# Patient Record
Sex: Female | Born: 1935 | Race: White | Hispanic: No
Health system: Midwestern US, Academic
[De-identification: ages and names within clinical notes are randomized; demographics above are authoritative.]

---

## 2020-07-17 ENCOUNTER — Encounter: Admit: 2020-07-17 | Discharge: 2020-07-17

## 2020-07-21 ENCOUNTER — Encounter: Admit: 2020-07-21 | Discharge: 2020-07-21 | Payer: MEDICARE

## 2020-07-24 ENCOUNTER — Encounter: Admit: 2020-07-24 | Discharge: 2020-07-24 | Payer: MEDICARE

## 2020-07-24 NOTE — Telephone Encounter
Pre Visit Planning- New Patient    Records received: No    Orders have been NA    Patient inactive in MyChart..     Left voicemail message.    Updated chart: Not assessed    MRI PACS 2021

## 2020-07-28 ENCOUNTER — Encounter: Admit: 2020-07-28 | Discharge: 2020-07-28 | Payer: MEDICARE

## 2020-07-28 ENCOUNTER — Ambulatory Visit: Admit: 2020-07-28 | Discharge: 2020-07-28 | Payer: MEDICARE

## 2020-07-28 DIAGNOSIS — M461 Sacroiliitis, not elsewhere classified: Secondary | ICD-10-CM

## 2020-07-28 DIAGNOSIS — M5137 Other intervertebral disc degeneration, lumbosacral region: Secondary | ICD-10-CM

## 2020-07-28 DIAGNOSIS — I10 Essential (primary) hypertension: Secondary | ICD-10-CM

## 2020-07-28 NOTE — Patient Instructions
Consider sacroiliac Joint injection for low back pain.

## 2020-07-28 NOTE — Progress Notes
SPINE CENTER HISTORY AND PHYSICAL    Chief Complaint:   Chief Complaint   Patient presents with   ? New Patient     lbp       Subjective     HISTORY OF PRESENT ILLNESS:   Cynthia Vega is a 87 y.o. female who  has a past medical history of Hypertension. who presents for evaluation.Patient is presenting with longstanding history of low back pain.  She reports the pain started back in 2012.  Pain is mostly in her lower back and radiates down her left leg to her left knee.  She reports being seen by a pain specialist in [place] who did a right-sided L4-5, L5-S1 epidural steroid injection which it sounds like gave her relief of symptoms going down the leg but continued low back pain.  She reports the pain is constant nature, aching, tiring, nagging, shooting, sharp.  Pain is made better with laying down, standing.  Pain is worse with sitting in 1 position for too long and trying to get up.  Pain is better in the morning when she gets out of bed.  She is unable to take NSAIDs due to her blood thinning medication.  She is taking oxycodone and Tylenol with some relief.  She is done 6 weeks of formal physical therapy but was not making any progress so they discontinued it.  She has not had any spine surgery in the past.  She is scheduled to see her pain specialist in [place] in a week from now.             Amna Passmore denies any recent fevers, chills, infection, antibiotics, bowel or bladder incontinence, saddle anesthesia, bleeding issues, or recent anticoagulant.     ROS:   A 10 point review of systems is negative except for what is noted in the HPI above.    Past Medical History:  Medical History:   Diagnosis Date   ? Hypertension        Family History:  No family history on file.    Social History:  Lives in [place] [address]    Social History     Socioeconomic History   ? Marital status: Widowed   Tobacco Use   ? Smoking status: Never Smoker   ? Smokeless tobacco: Never Used   Substance and Sexual Activity   ? Alcohol use: Not Currently   ? Drug use: Never       Allergies:  No Known Allergies    Medications:    Current Outpatient Medications:   ?  acetaminophen (TYLENOL EXTRA STRENGTH) 500 mg tablet, Take 650 mg by mouth every 6 hours as needed., Disp: , Rfl:   ?  levothyroxine (SYNTHROID) 50 mcg tablet, levothyroxine 50 mcg tablet  TAKE 1 TABLET BY MOUTH EVERY DAY, Disp: , Rfl:   ?  losartan (COZAAR) 25 mg tablet, losartan 25 mg tablet  TAKE 1 TABLET BY MOUTH EVERY DAY, Disp: , Rfl:   ?  meloxicam (MOBIC) 15 mg tablet, meloxicam 15 mg tablet, Disp: , Rfl:   ?  metoprolol XL (TOPROL XL) 50 mg extended release tablet, metoprolol succinate ER 50 mg tablet,extended release 24 hr, Disp: , Rfl:   ?  minocycline (MINOCIN) 100 mg capsule, minocycline 100 mg capsule  TAKE 1 CAPSULE BY MOUTH TWICE A DAY, Disp: , Rfl:   ?  oxyCODONE (ROXICODONE) 5 mg tablet, Take 5 mg by mouth., Disp: , Rfl:   ?  rivaroxaban (XARELTO PO), Take  by mouth., Disp: , Rfl:   ?  traMADoL (ULTRAM) 50 mg tablet, tramadol 50 mg tablet  TAKE 1 TABLET BY MOUTH 3 TIMES A DAY AS NEEDED FOR PAIN . MAY CAUSE DROWSINESS, Disp: , Rfl:     Physical examination:   BP (!) 124/91 (BP Source: Arm, Left Upper, Patient Position: Sitting)  - Pulse 78  - SpO2 99%   Pain Score: Ten    Gen: Alert & Oriented X 3  HEENT: EOMI  Neck: Supple, no elevated JVP  Heart: Extremities well perfused  Lungs: non labored breathing  Abdomen: Soft, non-tender, non-distended  Skin: no gross lesions appreciated  Ext: purposeful movement of extremities     LOWER EXTREMITIES  MS:   Root Right Left   Hip Flexion L2 5 5   Knee Flexion L5/S1 5 5   Knee Extension L3 5 5   Dorsiflexion L4 5 5   Plantarflexion S1 5 5   EHL Extension L5 5 5     Gait was smooth and symmetric with equal arm swing.        No pain reproduced with facet loading.    No tenderness to palpation along the spinous process, facet joints, paraspinal musculature, gluteal musculature, greater trochanters.    Tender palpation bilateral sacroiliac joints    Patient is able to forward flex to knees, and able to extend without significant pain.    Full ROM bilateral lower extremities.      Negative slump testing.  Negative straight leg testing to 30-75 degrees.      Negative FADIR testing.  Positive bilateral Patrick-FABER testing.  Positive bilateral Advice worker.   Positive bilateral Gaenslens testing.  Positive bilateral SI compression    Neuro:  DTR's 2+ right patella, 2+ left patella  2+ right achilles, 2+ left achilles   Lower Extremity Tone Normal   Lower Extremity Sensation Intact to light touch bilaterally     DIAGNOSTICS:  X-ray L-spine showing scoliosis, degenerative disc disease and lower lumbar spine, no anterior retrolisthesis, SI joints with sclerosing.      Last Cr and LFT's:  No results found for: CR, AST, ALT, ALKPHOS, TOTBILI         Assessment:  The pain complaints are most likely due to:  1. Sacroiliitis (HCC)    2. DDD (degenerative disc disease), lumbosacral      Cynthia Vega is a 87 y.o. female who  has a past medical history of Hypertension. who presents for evaluation of pain.    Plan:  I think is reasonable for her to consider bilateral sacroiliac joint injections.  She wants to speak with her pain specialist in [place] to see if she can have it done there.  If she is getting pushback or is unable to get it done there we can get her scheduled for bilateral SI joint injections here.  At that time we will provide her with [facility] 27 visits.    Risks/benefits of all pharmacologic and interventional treatments discussed and questions answered.     Thank you for this kind referral for consultation. Please feel free to contact me with any questions or concerns.

## 2020-08-11 ENCOUNTER — Ambulatory Visit: Admit: 2020-08-11 | Discharge: 2020-08-11 | Payer: MEDICARE

## 2020-08-11 ENCOUNTER — Encounter: Admit: 2020-08-11 | Discharge: 2020-08-11 | Payer: MEDICARE

## 2020-08-11 DIAGNOSIS — M533 Sacrococcygeal disorders, not elsewhere classified: Secondary | ICD-10-CM

## 2020-08-11 DIAGNOSIS — M461 Sacroiliitis, not elsewhere classified: Secondary | ICD-10-CM

## 2020-08-11 MED ORDER — TRIAMCINOLONE ACETONIDE 40 MG/ML IJ SUSP
80 mg | Freq: Once | EPIDURAL | 0 refills | Status: CP
Start: 2020-08-11 — End: ?

## 2020-08-11 MED ORDER — IOHEXOL 300 MG IODINE/ML IV SOLN
2 mL | Freq: Once | 0 refills | Status: CP
Start: 2020-08-11 — End: ?

## 2020-08-11 MED ORDER — LIDOCAINE (PF) 10 MG/ML (1 %) IJ SOLN
2 mL | Freq: Once | INTRAMUSCULAR | 0 refills | Status: CP
Start: 2020-08-11 — End: ?

## 2020-08-11 NOTE — Procedures
Attending Surgeon: Lizbeth Bark, MD    Anesthesia: Local    Pre-Procedure Diagnosis:   1. Sacroiliitis (HCC)        Post-Procedure Diagnosis:   1. Sacroiliitis (HCC)             [facility] SPINE SI JOINT INJECT ANESTH/STEROID PROC  Location: sacroiliac joint     Consent:   Consent obtained: written and verbal  Consent given by: patient  Risks discussed: damage to surrounding structures, infection and pain  Alternatives discussed: alternative treatment     Universal Protocol:  Relevant documents: relevant documents present and verified  Test results: test results available and properly labeled  Imaging studies: imaging studies available  Required items: required blood products, implants, devices, and special equipment available  Site marked: the operative site was marked  Patient identity confirmed: Patient identify confirmed verbally with patient.        Time out: Immediately prior to procedure a time out was called to verify the correct patient, procedure, equipment, support staff and site/side marked as required      Procedures Details:   Indications: pain   Prep: 2% chlorhexidine  Guidance: fluoroscopy  Needle size: 22 G  Approach: posterior  Patient tolerance: Patient tolerated the procedure well with no immediate complications. Pressure was applied, and hemostasis was accomplished.  Comments: The procedure risks and benefits were explained.  Informed consent was obtained from the patient.  The patient was placed in prone position on the fluoroscopy table.  Blood pressure cuff and oxygen saturation monitors were attached and the patient was monitored throughout the procedure.  The left SI joint was identified with the use of fluoroscopy in the AP view.  The skin was prepped using Chlorhexadine and draped in aseptic fashion.  C-arm was rotated obliquely towards the right side to line up the anterior and posterior margins of the SI joint.  Skin and subcutaneous tissue were anesthetized using 2.5 mL of 1 percent lidocaine with 25-gauge needle.  A 3-1/2-inch 22-gauge spinal needle was advanced parallel to the x-ray beam towards the lower third of the joint line.  The needle was advanced slowly until the tip of the needle made contact with the bone.  The needle was walked off from the bone to the joint space.  We subsequently injected 0.4 mL of Isovue contrast dye.  An arthrogram was seen.  After negative aspiration, a solution containing 1 mL of 1 percent lidocaine and 40 mg of triamcinolone was injected.  The stylet was re-inserted and needle was then removed.  There was no sensory or motor deficit in the extremity noted.     Attention was then taken to the right SI joint, which was identified with the use of fluoroscopy in the AP view.  The skin was prepped using Chlorhexadine and draped in aseptic fashion.  The C-arm was rotated obliquely towards the left side to line up the anterior and posterior margins at the SI joint.  The skin and subcutaneous tissue was anesthetized using 2.5 mL of 1 percent lidocaine with 25-gauge needle.  A 3-1/2-inch, 22-gauge spinal needle was advanced parallel to the x-ray beam towards the lower third of the joint line.  The needle was advanced slowly until the tip of the needle made contact with the bone.  The needle was walked off from the bone to the joint space.  We injected 0.4 mL of Isovue contrast dye and an arthrogram was seen.  After negative aspiration, a solution containing 1 mL  of 1 percent lidocaine and 40 mg of triamcinolone was injected.  The stylet was re-inserted and the needle was then removed.  There was no sensory or motor deficit in the extremity noted.      After the procedure, the patient's blood pressure, heart rate, oxygen saturation, and VAS were recorded in the chart.  There were no complications.  The patient tolerated the procedure well and was brought to the recovery room for observation in stable condition and discharged with written discharge instructions.     PLAN OF CARE:  The patient is to follow up in the interventional spine clinic in 6-8 weeks.     The patient was advised to contact the interventional spine center for any of the following:    1. Fever, chills, or night sweats.  2. New onset severe sharp pain.  3. Any new upper or lower extremity weakness or numbness.  4. Any questions regarding the procedure.     If unable to contact the interventional spine center, the patient was instructed to go to the local emergency room.           Administrations This Visit     iohexol (OMNIPAQUE-300) 300 mg/mL injection 2 mL     Admin Date  08/11/2020 Action  Given Dose  2 mL Route  SEE ADMIN INSTRUCTIONS Administered By  Beatriz Chancellor, RN          lidocaine PF 1% (10 mg/mL) injection 2 mL     Admin Date  08/11/2020 Action  Given Dose  2 mL Route  Injection Administered By  Beatriz Chancellor, RN          triamcinolone acetonide [facility]) injection 80 mg     Admin Date  08/11/2020 Action  Given Dose  80 mg Route  Epidural Administered By  Beatriz Chancellor, RN              Estimated blood loss: none or minimal  Specimens: none  Patient tolerated the procedure well with no immediate complications. Pressure was applied, and hemostasis was accomplished.

## 2020-08-11 NOTE — Discharge Instructions - Supplementary Instructions
GENERAL POST PROCEDURE INSTRUCTIONS    Time:____________________  Physician:_________________________________    Procedure Completed Today:  Joint Injection (hip, knee, shoulder)  Cervical Epidural Steroid Injection  Cervical Transforaminal Steroid Injection  Trigger Point Injection  Other___________________________ Thoracic Epidural Steroid Injection  Lumbar Epidural Steroid Injection  Lumbar Transforaminal Steroid Injection  Facet Joint Injection     Important information following your procedure today:  You may drive today     If you had sedation ,you may NOT drive today  Rest at home for the next 6 hours.  You may then begin to resume your normal activities.  DO NOT drive any vehicle, operate any power tools, drink alcohol, make any major decisions, or sign any legal documents for the next 12 hours.  Pain relief may not be immediate. It is possible you may even experience an increase in pain during the first 24-48 hours followed by a gradual decrease of your pain.  Though the procedure is generally safe and complications are rare, we do ask that you be aware of any of the following:  Any swelling, persistent redness, new bleeding or drainage from the site of the injection.  You should not experience a severe headache.  You should not run a fever over 101oF.  New onset of sharp, severe back and or neck pain.  New onset of upper or lower extremity numbness or weakness.  New difficulty controlling bowel or bladder function after injection.  New shortness of breath.  If any of these occur, please call to report this occurrence to a nurse at [phone]. If you are calling after 4:00pm, on the weekends or holidays please call [phone] and ask to have the pain management resident physician on call for the physician paged or go to your local emergency room.   You may experience soreness at the injection site. Ice can be applied at 20 minute intervals for the first 24 hours. The following day you may alternate ice with heat if you are experiencing muscle tightness, otherwise continue with ice. Ice works best at decreasing pain. Avoid application of direct heat, hot showers or hot tubs today.  Avoid strenuous activity today. You many resume your regular activities and exercise tomorrow.  Patients with diabetes may see an elevation in blood sugars for 7-10 days after the injection. It is important to pay close attention to you diet, check your blood sugars daily and repost extreme elevations to the physician that treats your diabetes.  Patients taking daily blood thinners can resume their regular dose this evening.  It is important that you take all medications ordered by your pain physician. Taking medications as ordered is an important part of you pain care plan. If you cannot continue the medication plan, please notify the physician.  Possible side effects to steroids that may occur:  Flushing or redness of the face  Irritability  Fluid retention  Change in women's menses  Follow up appointment as needed if in the event you are unable to keep an appointment please notify the scheduler 24 hours in advance at [phone].    ____________________________________  ____________________________________

## 2020-08-11 NOTE — Progress Notes
SPINE CENTER  INTERVENTIONAL PAIN PROCEDURE HISTORY AND PHYSICAL    No chief complaint on file.      HISTORY OF PRESENT ILLNESS:  Cynthia Vega is a 87 y.o. year old female who presents for injection.  Denies fevers, chills, or recent hospitalizations.  Patient denies currently taking blood thinning medications.       Medical History:   Diagnosis Date    Hypertension        No past surgical history on file.    family history is not on file.    Social History     Socioeconomic History    Marital status: Widowed   Tobacco Use    Smoking status: Never Smoker    Smokeless tobacco: Never Used   Substance and Sexual Activity    Alcohol use: Not Currently    Drug use: Never       No Known Allergies    There were no vitals filed for this visit.          REVIEW OF SYSTEMS: 10 point ROS obtained and negative except back pain      PHYSICAL EXAM:  General: 87 y.o. female appears stated age, in no acute distress  HEENT: Normocephalic, atraumatic  Neck: No thyroidmegaly  Cardiovascular: Well perfused  Pulmonary: Unlabored respirations  Extremities: No cyanosis, clubbing, or edema  Skin: No lesions seen on exposed skin  Psychiatric:  Appropriate mood and affect  Musculoskeletal: No atrophy.   Neurologic: Antigravity strength in all extremities. CN II -XII grossly intact.  Alert and oriented x 3.           IMPRESSION:    1. Sacroiliitis (HCC)         PLAN: Sacroiliac Joint Injection bilateral

## 2020-08-13 ENCOUNTER — Encounter: Admit: 2020-08-13 | Discharge: 2020-08-13 | Payer: MEDICARE

## 2020-08-13 NOTE — Telephone Encounter
S/I inj done 7/19.22, daughter reporting

## 2020-09-01 ENCOUNTER — Encounter: Admit: 2020-09-01 | Discharge: 2020-09-01 | Payer: MEDICARE

## 2020-09-01 NOTE — Telephone Encounter
S/i inj in July, called reporting pain in lumbar and radiating in bilat LE to mid thigh.Peri Jefferson relief with S/i inj. She will monitor and call to sched eval if needed.

## 2021-02-01 ENCOUNTER — Encounter: Admit: 2021-02-01 | Discharge: 2021-02-01 | Payer: MEDICARE

## 2021-02-01 ENCOUNTER — Ambulatory Visit: Admit: 2021-02-01 | Discharge: 2021-02-01 | Payer: MEDICARE

## 2021-02-01 DIAGNOSIS — M461 Sacroiliitis, not elsewhere classified: Secondary | ICD-10-CM

## 2021-02-01 DIAGNOSIS — I10 Essential (primary) hypertension: Secondary | ICD-10-CM

## 2021-02-01 NOTE — Progress Notes
SPINE CENTER CLINIC NOTE       SUBJECTIVE: 88 year old female presenting in follow-up from last visit on 08/11/2020.  At that time we did bilateral SI joint injections.  She reports having great relief after the injection.  She had greater than 90% relief for at least 1 to 2 months.  She reports the pain returned.  The pain is on the left side greater than right side but in similar area.  She does reporting some radiating pain going down her left leg to about her left knee.  Some numbness and tingling going down into her foot and ankle.  She has pretty significant scoliosis and spinal stenosis.  She has had an epidural steroid injection in the past without significant relief.         Review of Systems    Current Outpatient Medications:   ?  acetaminophen (TYLENOL EXTRA STRENGTH) 500 mg tablet, Take 650 mg by mouth every 6 hours as needed., Disp: , Rfl:   ?  levothyroxine (SYNTHROID) 50 mcg tablet, levothyroxine 50 mcg tablet  TAKE 1 TABLET BY MOUTH EVERY DAY, Disp: , Rfl:   ?  losartan (COZAAR) 25 mg tablet, losartan 25 mg tablet  TAKE 1 TABLET BY MOUTH EVERY DAY, Disp: , Rfl:   ?  meloxicam (MOBIC) 15 mg tablet, meloxicam 15 mg tablet, Disp: , Rfl:   ?  metoprolol XL (TOPROL XL) 50 mg extended release tablet, metoprolol succinate ER 50 mg tablet,extended release 24 hr, Disp: , Rfl:   ?  minocycline (MINOCIN) 100 mg capsule, minocycline 100 mg capsule  TAKE 1 CAPSULE BY MOUTH TWICE A DAY, Disp: , Rfl:   ?  oxyCODONE (ROXICODONE) 5 mg tablet, Take 5 mg by mouth., Disp: , Rfl:   ?  rivaroxaban (XARELTO PO), Take  by mouth., Disp: , Rfl:   ?  traMADoL (ULTRAM) 50 mg tablet, tramadol 50 mg tablet  TAKE 1 TABLET BY MOUTH 3 TIMES A DAY AS NEEDED FOR PAIN . MAY CAUSE DROWSINESS, Disp: , Rfl:   No Known Allergies  Physical Exam  Vitals:    02/01/21 1036   PainSc: Ten        Pain Score: Ten  There is no height or weight on file to calculate BMI.  Physical Exam ?  Gen: comfortable, NAD ?  HEENT: NCAT, anicteric sclera ?  Card: Extremities warm, well-perfused, cap refill <2sec ?  Pulm: no distress, not cyanotic ?  Abd: soft, non-distended ?  Skin: Skin is warm and dry.  Psychiatric: normal mood and affect. Behavior is normal.     Neuro ?  CNII-XII grossly normal ?  Mental status appropriate     MSK: ?  Inspection: grossly symmetric, no obvious deformity, no erythema ?  Palpation: Tender palpation bilateral SI joints  Maneuvers: Patrick's, Gaenslen's, SI compression all positive bilaterally             IMPRESSION:  1. Sacroiliitis (HCC)          PLAN: Scheduling repeat SI joint injections as she had such a good response to the first 1.  We will do a quick follow-up after the SI joint injection to see if it helps with pain going down the leg.

## 2021-02-09 ENCOUNTER — Ambulatory Visit: Admit: 2021-02-09 | Discharge: 2021-02-09 | Payer: MEDICARE

## 2021-02-09 ENCOUNTER — Encounter: Admit: 2021-02-09 | Discharge: 2021-02-09 | Payer: MEDICARE

## 2021-02-09 DIAGNOSIS — M461 Sacroiliitis, not elsewhere classified: Secondary | ICD-10-CM

## 2021-02-09 MED ORDER — LIDOCAINE (PF) 10 MG/ML (1 %) IJ SOLN
2 mL | Freq: Once | INTRAMUSCULAR | 0 refills | Status: CP
Start: 2021-02-09 — End: ?

## 2021-02-09 MED ORDER — BUPIVACAINE (PF) 0.5 % (5 MG/ML) IJ SOLN
5 mL | Freq: Once | INTRAMUSCULAR | 0 refills | Status: CP
Start: 2021-02-09 — End: ?

## 2021-02-09 MED ORDER — IOHEXOL 240 MG IODINE/ML IV SOLN
1 mL | Freq: Once | EPIDURAL | 0 refills | Status: CP
Start: 2021-02-09 — End: ?

## 2021-02-09 MED ORDER — TRIAMCINOLONE ACETONIDE 40 MG/ML IJ SUSP
80 mg | Freq: Once | EPIDURAL | 0 refills | Status: CP
Start: 2021-02-09 — End: ?

## 2021-02-09 NOTE — Discharge Instructions - Supplementary Instructions
.  General Post-Procedure Instructions: Pain Injection      Procedure Completed Today:  Joint Injection (hip, knee, shoulder)  Epidural Steroid Injection (Cervical; Thoracic; Lumbar)  Transforaminal Steroid Injection (Cervical; Lumbar)  Facet Joint Injection  Other: ___________________________    Important information following your procedure today:  You may drive today     If you had sedation, you may NOT drive today  Rest at home for the next 6 hours.  You may then begin to resume your normal activities.  DO NOT drive any vehicle, operate any power tools, drink alcohol, make any major decisions, or sign any legal documents for the next 12 hours.  Pain relief may not be immediate. It is possible you may even experience an increase in pain during the first 24-48 hours followed by a gradual decrease of your pain.  Though the procedure is generally safe, and complications are rare, we do ask that you be aware of any of the following:  Any swelling, persistent redness, new bleeding or drainage from the site of the injection.  You should not experience a severe headache.  You should not run a fever over 101oF.  New onset of sharp, severe back and or neck pain.  New onset of upper or lower extremity numbness or weakness.  New difficulty controlling bowel or bladder function after injection.  New shortness of breath.  If any of these occur, please call to report this occurrence to a nurse at [phone]. If you are calling after 4:00pm, on the weekends or holidays please call [phone] and ask to have the pain management resident physician on call for the physician paged or go to your local emergency room.   You may experience soreness at the injection site. Ice can be applied at 20-minute intervals for the first 24 hours. The following day you may alternate ice with heat if you are experiencing muscle tightness, otherwise continue with ice. Ice works best at decreasing pain. Avoid application of direct heat, hot showers or hot tubs today.  Avoid strenuous activity today. You many resume your regular activities and exercise tomorrow.  Patients with diabetes may see an elevation in blood sugars for 7-10 days after the injection. It is important to pay close attention to your diet, check your blood sugars daily and report extreme elevations to the physician that treats your diabetes.  Patients taking daily blood thinners can resume their regular dose this evening.  It is important that you take all medications ordered by your pain physician. Taking medications as ordered is an important part of your pain care plan. If you cannot continue the medication plan, please notify the physician.    Possible side effects to steroids that may occur:  Flushing or redness of the face  Irritability  Fluid retention  Change in women's menses    Follow up appointment as needed. In the event you are unable to keep an appointment, please notify the scheduler 24 hours in advance at [phone].

## 2021-02-09 NOTE — Progress Notes
SPINE CENTER  INTERVENTIONAL PAIN PROCEDURE HISTORY AND PHYSICAL    No chief complaint on file.      HISTORY OF PRESENT ILLNESS:  Cynthia Vega is a 88 y.o. year old female who presents for injection.  Denies fevers, chills, or recent hospitalizations.  Patient denies currently taking blood thinning medications.       Medical History:   Diagnosis Date    Hypertension        No past surgical history on file.    family history is not on file.    Social History     Socioeconomic History    Marital status: Widowed   Tobacco Use    Smoking status: Never    Smokeless tobacco: Never   Substance and Sexual Activity    Alcohol use: Not Currently    Drug use: Never       Allergies   Allergen Reactions    Gabapentin RASH       There were no vitals filed for this visit.          REVIEW OF SYSTEMS: 10 point ROS obtained and negative except back pain      PHYSICAL EXAM:  General: 87 y.o. female appears stated age, in no acute distress  HEENT: Normocephalic, atraumatic  Neck: No thyroidmegaly  Cardiovascular: Well perfused  Pulmonary: Unlabored respirations  Extremities: No cyanosis, clubbing, or edema  Skin: No lesions seen on exposed skin  Psychiatric:  Appropriate mood and affect  Musculoskeletal: No atrophy.   Neurologic: Antigravity strength in all extremities. CN II -XII grossly intact.  Alert and oriented x 3.           IMPRESSION:    1. Sacroiliitis (HCC)         PLAN: Sacroiliac Joint Injection bilateral

## 2021-02-09 NOTE — Procedures
Attending Surgeon: Lizbeth Bark, MD    Anesthesia: Local    Pre-Procedure Diagnosis:   1. Sacroiliitis (HCC)        Post-Procedure Diagnosis:   1. Sacroiliitis (HCC)             [facility] SP[facility] SI JOINT INJECT ANESTH/STEROID PROC  Location: sacroiliac joint     Consent:   Consent obtained: written and verbal  Consent given by: patient  Risks discussed: damage to surrounding structures, infection and pain  Alternatives discussed: alternative treatment     Universal Protocol:  Relevant documents: relevant documents present and verified  Test results: test results available and properly labeled  Imaging studies: imaging studies available  Required items: required blood products, implants, devices, and special equipment available  Site marked: the operative site was marked  Patient identity confirmed: Patient identify confirmed verbally with patient.        Time out: Immediately prior to procedure a time out was called to verify the correct patient, procedure, equipment, support staff and site/side marked as required      Procedures Details:   Indications: pain   Prep: 2% chlorhexidine  Guidance: fluoroscopy  Needle size: 22 G  Approach: posterior  Patient tolerance: Patient tolerated the procedure well with no immediate complications. Pressure was applied, and hemostasis was accomplished.  Comments: The procedure risks and benefits were explained.  Informed consent was obtained from the patient.  The patient was placed in prone position on the fluoroscopy table.  Blood pressure cuff and oxygen saturation monitors were attached and the patient was monitored throughout the procedure.  The left SI joint was identified with the use of fluoroscopy in the AP view.  The skin was prepped using Chlorhexadine and draped in aseptic fashion.  C-arm was rotated obliquely towards the right side to line up the anterior and posterior margins of the SI joint.  Skin and subcutaneous tissue were anesthetized using 2.5 mL of 1 percent lidocaine with 25-gauge needle.  A 3-1/2-inch 22-gauge spinal needle was advanced parallel to the x-ray beam towards the lower third of the joint line.  The needle was advanced slowly until the tip of the needle made contact with the bone.  The needle was walked off from the bone to the joint space.  We subsequently injected 0.4 mL of Isovue contrast dye.  An arthrogram was seen.  After negative aspiration, a solution containing 1 mL of 1 percent lidocaine and 40 mg of triamcinolone was injected.  The stylet was re-inserted and needle was then removed.  There was no sensory or motor deficit in the extremity noted.     Attention was then taken to the right SI joint, which was identified with the use of fluoroscopy in the AP view.  The skin was prepped using Chlorhexadine and draped in aseptic fashion.  The C-arm was rotated obliquely towards the left side to line up the anterior and posterior margins at the SI joint.  The skin and subcutaneous tissue was anesthetized using 2.5 mL of 1 percent lidocaine with 25-gauge needle.  A 3-1/2-inch, 22-gauge spinal needle was advanced parallel to the x-ray beam towards the lower third of the joint line.  The needle was advanced slowly until the tip of the needle made contact with the bone.  The needle was walked off from the bone to the joint space.  We injected 0.4 mL of Isovue contrast dye and an arthrogram was seen.  After negative aspiration, a solution containing 1 mL  of 1 percent lidocaine and 40 mg of triamcinolone was injected.  The stylet was re-inserted and the needle was then removed.  There was no sensory or motor deficit in the extremity noted.      After the procedure, the patient's blood pressure, heart rate, oxygen saturation, and VAS were recorded in the chart.  There were no complications.  The patient tolerated the procedure well and was brought to the recovery room for observation in stable condition and discharged with written discharge instructions.     PLAN OF CARE:  The patient is to follow up in the interventional spine clinic in 6-8 weeks.     The patient was advised to contact the interventional spine center for any of the following:    1. Fever, chills, or night sweats.  2. New onset severe sharp pain.  3. Any new upper or lower extremity weakness or numbness.  4. Any questions regarding the procedure.     If unable to contact the interventional spine center, the patient was instructed to go to the local emergency room.           Administrations This Visit     bupivacaine PF (MARCAINE) 0.5 % injection 5 mL     Admin Date  02/09/2021 Action  Given Dose  5 mL Route  Injection Administered By  Charolette Forward, RN          iohexoL (OMNIPAQUE-240) 240 mg/mL injection 1 mL     Admin Date  02/09/2021 Action  Given Dose  1 mL Route  Epidural Administered By  Charolette Forward, RN          lidocaine PF 1% (10 mg/mL) injection 2 mL     Admin Date  02/09/2021 Action  Given Dose  2 mL Route  Injection Administered By  Charolette Forward, RN          triamcinolone acetonide [facility]) injection 80 mg     Admin Date  02/09/2021 Action  Given Dose  80 mg Route  Epidural Administered By  Charolette Forward, RN              Estimated blood loss: none or minimal  Specimens: none  Patient tolerated the procedure well with no immediate complications. Pressure was applied, and hemostasis was accomplished.

## 2021-05-21 ENCOUNTER — Encounter: Admit: 2021-05-21 | Discharge: 2021-05-21 | Payer: MEDICARE

## 2021-09-02 ENCOUNTER — Encounter: Admit: 2021-09-02 | Discharge: 2021-09-02 | Payer: MEDICARE

## 2021-09-24 ENCOUNTER — Encounter: Admit: 2021-09-24 | Discharge: 2021-09-24 | Payer: MEDICARE

## 2021-09-24 ENCOUNTER — Ambulatory Visit: Admit: 2021-09-24 | Discharge: 2021-09-24 | Payer: MEDICARE

## 2021-09-24 DIAGNOSIS — M5416 Radiculopathy, lumbar region: Secondary | ICD-10-CM

## 2021-09-24 DIAGNOSIS — I10 Essential (primary) hypertension: Secondary | ICD-10-CM

## 2021-09-24 NOTE — Progress Notes
SPINE CENTER CLINIC NOTE       Cynthia Vega is a 88 y.o. female who  has a past medical history of Hypertension. who presents for follow up. Last seen on 02/09/2021 when she underwent a bilateral sacroiliac joint injection.      SUBJECTIVE: Shortly after her prior visit, she sustained a fall and had a fracture of her pelvis.  She was hospitalized and had short inpatient rehabilitation stay.  She reports that she only remembers the SI joint injection into her right side and it did not provide significant relief for longer than 1 month.  She does not remember undergoing  bilateral a bilateral SI joint injection in July 2022.  She reports that any epidural or SI joint injections she has had in the past has only provided short-term relief.  Her pain is worse today on the left side and radiates down towards her anterior thigh.  She describes a burning sensation.  She feels that this sensation came on after a physician was checking her pelvis.  It does not cross the knee.  She does not endorse any symptoms into her lower legs.         Review of Systems    Current Outpatient Medications:   ?  acetaminophen (TYLENOL EXTRA STRENGTH) 500 mg tablet, Take 650 mg by mouth every 6 hours as needed., Disp: , Rfl:   ?  levothyroxine (SYNTHROID) 50 mcg tablet, levothyroxine 50 mcg tablet  TAKE 1 TABLET BY MOUTH EVERY DAY, Disp: , Rfl:   ?  losartan (COZAAR) 25 mg tablet, losartan 25 mg tablet  TAKE 1 TABLET BY MOUTH EVERY DAY, Disp: , Rfl:   ?  meloxicam (MOBIC) 15 mg tablet, meloxicam 15 mg tablet, Disp: , Rfl:   ?  metoprolol XL (TOPROL XL) 50 mg extended release tablet, metoprolol succinate ER 50 mg tablet,extended release 24 hr, Disp: , Rfl:   ?  minocycline (MINOCIN) 100 mg capsule, minocycline 100 mg capsule  TAKE 1 CAPSULE BY MOUTH TWICE A DAY, Disp: , Rfl:   ?  oxyCODONE (ROXICODONE) 5 mg tablet, Take one tablet by mouth., Disp: , Rfl:   ?  rivaroxaban (XARELTO PO), Take  by mouth., Disp: , Rfl:   ?  traMADoL (ULTRAM) 50 mg tablet, tramadol 50 mg tablet  TAKE 1 TABLET BY MOUTH 3 TIMES A DAY AS NEEDED FOR PAIN . MAY CAUSE DROWSINESS, Disp: , Rfl:   Allergies   Allergen Reactions   ? Gabapentin RASH     Physical Exam  Vitals:    09/24/21 1108   BP: (!) 151/89   BP Source: Arm, Left Upper   Pulse: 81   SpO2: 100%   PainSc: Ten   Weight: 47.2 kg (104 lb)   Height: 162.6 cm (5' 4)     Oswestry Total Score:: 38  Pain Score: Ten  Body mass index is 17.85 kg/m?Marland Kitchen    Physical Exam ?  Gen: comfortable, NAD ?  HEENT: NCAT, anicteric sclera ?  Card: Extremities warm, well-perfused, cap refill <2sec ?  Pulm: no distress, not cyanotic ?  Abd: soft, non-distended ?  Skin: Skin is warm and dry.  Psychiatric: normal mood and affect. Behavior is normal.   Extremities: Chronic venous stasis changes in bilateral lower extremities    Neuro ?  CNII-XII grossly normal ?  5/5 strength in BLE  Sensation: Altered to light touch in the left L4 and L5 dermatomes  Reflexes: symmetric reflexes of patella and Achille's  Neg  Ankle clonus bilaterally ?  Negative Hoffman's bilaterally ?  Mental status appropriate     MSK: ?  Inspection: grossly symmetric, no obvious deformity, no erythema ?  Palpation: Tenderness to palpation over the left gluteal musculature.  Not much tenderness in the bilateral SI joints  ROM:  ?Slightly limited in lumbar extension           IMPRESSION:  1. Lumbar radiculopathy      Ms. Orton is a very pleasant 88 year old who is presenting for evaluation of chronic low back pain with new left radicular symptoms into the thigh after a pelvic fracture consistent with a lumbar radiculopathy.  She did not get any significant long-term benefit with SI joint injections in the past.    PLAN:    1. Interventions: Will schedule for left L3-L4 and L4-L5 TFESI at first available appointment.   2. Medications: Continue current pharmacologic regimen.   3. Imaging: Lumbar x-rays with flexion and extension in the setting of prior pelvic fracture  4. Continue PT/Exercise therapy as tolerated.  Home exercises were provided for her today  5. Follow up after procedure.     Lydia Guiles, MD  Interventional Spine and Musculoskeletal Medicine Fellow  PM&R    ATTESTATION    I personally performed the key portions of the E/M visit, discussed case with fellow and concur with fellow documentation of history, physical exam, assessment, and treatment plan unless otherwise noted.    Staff name:  Lizbeth Bark, MD Date:  09/24/2021

## 2021-10-19 ENCOUNTER — Encounter: Admit: 2021-10-19 | Discharge: 2021-10-19 | Payer: MEDICARE

## 2021-10-25 ENCOUNTER — Encounter: Admit: 2021-10-25 | Discharge: 2021-10-25 | Payer: MEDICARE

## 2021-10-25 DIAGNOSIS — S3282XA Multiple fractures of pelvis without disruption of pelvic ring, initial encounter for closed fracture: Secondary | ICD-10-CM

## 2021-10-25 NOTE — Telephone Encounter
**   Images requested of pelvis from [facility] via the [facility]     Examination:  XR pelvis 1-2V Sex:  F DOB:  04-19-1932 Account:  [id number]    Primary: Dorris Fetch PA-C Da  te: 04/15/21  Attending: Newt Minion PA-C    Consulting:  Ordering: Rooney,Lexy J PA-C    DIAGNOSTIC STUDIES    EXAM    AP PELVIS    INDICATION    fracture follow-up  Pelvis fx f/u x one month. JT/CS    TECHNIQUE    Single view of the pelvis    COMPARISONS    03/16/2018    FINDINGS    Comminuted fracture of the right superior inferior pubic rami extending into the  right pubic symphysis is present in comparison to the prior study, there has  been interval increase displacement of fracture fragments. Evaluation for callus  formation is limited secondary to overlying bowel content in the rectum. Further  evaluation (additional imaging) is as clinically indicated.    Severe superomedial narrowing of the left hip is identified with sclerosis and  spurring. This is unchanged. Mild degenerative changes are present of the right  hip. Minimal spondylitic changes of the lower lumbar spine are present.    Large colonic content is present.    IMPRESSION    Comminuted right superior inferior pubic ramus fractures extending into the  pubic symphysis. Interval increase in displacement of fracture fragments is  noted as compared to 03/16/2021. Further evaluation is as clinically indicated.    Electronically signed by: Erik Obey. Pal MD (Apr 15, 2021 10:15:35)  Workstation IP: [id number]

## 2021-10-26 ENCOUNTER — Ambulatory Visit: Admit: 2021-10-26 | Discharge: 2021-10-26 | Payer: MEDICARE

## 2021-10-26 ENCOUNTER — Encounter: Admit: 2021-10-26 | Discharge: 2021-10-26 | Payer: MEDICARE

## 2021-10-26 DIAGNOSIS — S3282XA Multiple fractures of pelvis without disruption of pelvic ring, initial encounter for closed fracture: Secondary | ICD-10-CM

## 2021-10-26 DIAGNOSIS — I10 Essential (primary) hypertension: Secondary | ICD-10-CM

## 2021-10-26 NOTE — Progress Notes
Date of Service: 10/26/2021    Start time:  12:47   End time:  13:04    History of Present Illness  Cynthia Vega is a 88 y.o. female who presents today c/o pain in her pelvis.  She states her first fall was in February 2023 and she landed on her tailbone.  She then fell a second time in April 2023 and was hospitalized after that fall.   A week ago she also fell and not sure why. She didn't really have pain in the pelvis.  She was found to have bilateral sacral ala fractures.    Of note she has undergone bilateral sacroiliac joint injections.  Her biggest complaint is why is she falling.  Her granddaughter presents with her today.  They were under the impression that they were being seen today for her sciatic nerve complaints.      Medical History:   Diagnosis Date   ? Hypertension        No past surgical history on file.    No family history on file.    Social History     Occupational History   ? Not on file   Tobacco Use   ? Smoking status: Never   ? Smokeless tobacco: Never   Substance and Sexual Activity   ? Alcohol use: Not Currently   ? Drug use: Never   ? Sexual activity: Not on file       Allergies   Allergen Reactions   ? Gabapentin RASH          Review of Systems      Objective:         ? acetaminophen (TYLENOL EXTRA STRENGTH) 500 mg tablet Take 650 mg by mouth every 6 hours as needed.   ? levothyroxine (SYNTHROID) 50 mcg tablet levothyroxine 50 mcg tablet   TAKE 1 TABLET BY MOUTH EVERY DAY   ? losartan (COZAAR) 25 mg tablet losartan 25 mg tablet   TAKE 1 TABLET BY MOUTH EVERY DAY   ? meloxicam (MOBIC) 15 mg tablet meloxicam 15 mg tablet   ? metoprolol XL (TOPROL XL) 50 mg extended release tablet metoprolol succinate ER 50 mg tablet,extended release 24 hr   ? minocycline (MINOCIN) 100 mg capsule minocycline 100 mg capsule   TAKE 1 CAPSULE BY MOUTH TWICE A DAY   ? oxyCODONE (ROXICODONE) 5 mg tablet Take one tablet by mouth.   ? rivaroxaban (XARELTO PO) Take  by mouth.   ? traMADoL (ULTRAM) 50 mg tablet tramadol 50 mg tablet   TAKE 1 TABLET BY MOUTH 3 TIMES A DAY AS NEEDED FOR PAIN . MAY CAUSE DROWSINESS     Vitals:    10/26/21 1217   Resp: 16   PainSc: Ten   Weight: 47.2 kg (104 lb)   Height: 162.6 cm (5' 4)     Body mass index is 17.85 kg/m?Marland Kitchen     Physical Exam  Ortho Exam  General: WDWN female in NAD  HEENT: MMM  CV: RRR  RESP: unlabored  ABD: no gross masses  Looking at her leg lengths, the left leg appears slightly shorter than the right.  Slightly more external rotation on the right than left.  5/5 EHL, anterior tib and gastroc.  She can perform a SLR and against resistance 4/5 iliopsoas strength on right and this causes her discomfort over left sacral ala.  Her pelvis is stable to internal and external rotation.     Radiographs:   -04/15/21 pelvic xrays  reviewed and show a lateral compression type pelvic ring disruption with right superior and inferior pubic rami fx.   -CT pelvis dated 05/21/21 that I reviewed.  This demonstrates lateral compression pelvic ring disruption with sig internal rotation of right hemipelvis; bilateral subacute sacral ala fractures; degenerative scoliosis of spine.    -Xrays today of her pelvis are ordered and reviewed.         Assessment and Plan:  subacute healing pelvic ring disruption bil sacral ala fx and right superior and inferior pubic rami fx    -This fracture appears stable but to determine the amount of healing that has occurred I will order a CT scan.   - If the fractures has healed her pain is likely coming from  degenerative issues vs she hasn't healed the fracture and that is reason for her pain.  -I think she can be activity as tolerated      In the presence of Deasha Clendenin, MD,  I have taken down these notes Baldo Ash

## 2021-10-27 ENCOUNTER — Encounter: Admit: 2021-10-27 | Discharge: 2021-10-27 | Payer: MEDICARE

## 2021-10-27 NOTE — Telephone Encounter
Called pt and advised per Dr. Renne Crigler no issues seen w/ her healing pelvic fractures, only severe L hip arthritis which we could offer her a steroid injection for. Pt states she has an epidural injection (for back and si joint pain) on 10/17. We decided she will proceed with that epidural and if after that she still is experiencing intolerable L hip pain she will call our office to schedule a hip injection.

## 2021-10-28 ENCOUNTER — Encounter: Admit: 2021-10-28 | Discharge: 2021-10-28 | Payer: MEDICARE

## 2021-11-09 ENCOUNTER — Encounter: Admit: 2021-11-09 | Discharge: 2021-11-09 | Payer: MEDICARE

## 2021-11-09 ENCOUNTER — Ambulatory Visit: Admit: 2021-11-09 | Discharge: 2021-11-09 | Payer: MEDICARE

## 2021-11-09 DIAGNOSIS — M5416 Radiculopathy, lumbar region: Secondary | ICD-10-CM

## 2021-11-09 DIAGNOSIS — I10 Essential (primary) hypertension: Secondary | ICD-10-CM

## 2021-11-09 MED ORDER — IOHEXOL 240 MG IODINE/ML IV SOLN
1 mL | Freq: Once | EPIDURAL | 0 refills | Status: CP
Start: 2021-11-09 — End: ?

## 2021-11-09 MED ORDER — DEXAMETHASONE SODIUM PHOS (PF) 10 MG/ML IJ EPIDURAL SOLN
10 mg | Freq: Once | EPIDURAL | 0 refills | Status: CP
Start: 2021-11-09 — End: ?

## 2021-11-09 MED ORDER — LIDOCAINE (PF) 10 MG/ML (1 %) IJ SOLN
2 mL | Freq: Once | INTRAMUSCULAR | 0 refills | Status: CP
Start: 2021-11-09 — End: ?

## 2021-11-09 NOTE — Procedures
Attending Surgeon: Lizbeth Bark, MD    Anesthesia: Local    Pre-Procedure Diagnosis:   1. Lumbar radiculopathy        Post-Procedure Diagnosis:   1. Lumbar radiculopathy             [place] [facility] SPINE TRANSFORAMINAL LUMBAR/SACRAL  Procedure: transforaminal epidural    Laterality: left    Location: lumbar -  L4-5 and L3-4      Consent:   Consent obtained: verbal and written  Consent given by: patient  Risks discussed: no change or worsening in pain, weakness, nerve damage, swelling, seizure, infection, bruising, reaction to medication, bleeding, pneumo thorax and allergic reaction  Alternatives discussed: alternative treatment  Discussed with patient the purpose of the treatment/procedure, other ways of treating my condition, including no treatment/ procedure and the risks and benefits of the alternatives. Patient has decided to proceed with treatment/procedure.        Universal Protocol:  Relevant documents: relevant documents present and verified  Test results: test results available and properly labeled  Imaging studies: imaging studies available  Required items: required blood products, implants, devices, and special equipment available  Site marked: the operative site was marked  Patient identity confirmed: Patient identify confirmed verbally with patient.        Time out: Immediately prior to procedure a time out was called to verify the correct patient, procedure, equipment, support staff and site/side marked as required      Procedures Details:   Indications: pain   Prep: chlorhexidine  Patient position: prone  Number of Levels: 2  Approach: paramedian  Guidance: fluoroscopy  Contrast: Procedure confirmed with contrast under live fluoroscopy.  Needle and Epidural Catheter: tuohy  Needle size: 25 G  Injection procedure: Negative aspiration for blood  Patient tolerance: Patient tolerated the procedure well with no immediate complications. Pressure was applied, and hemostasis was accomplished.  Comments: Indications:Patient presents with a diagnosis of radiculopathy. The patient's history and physical exam were reviewed. The risks, benefits and alternatives to the procedure were discussed, and all questions were answered to the patient's satisfaction. The patient agreed to proceed, and written informed consent was obtained.     Procedure in Detail: IV was started? No    The patient was brought into the procedure room and placed in the prone position on the fluoroscopy table. Standard monitors were placed, and vital signs were observed throughout the procedure. The area of the lumbar spine was prepped with Chloroprep and draped in a sterile manner. ?    The left L4-L5 vertebral bodies were identified with AP fluoroscopy. An oblique view to the right was obtained to better visualize the inferior junction of the pedicle and transverse process. The 6 o'clock position of the pedicle was marked and identified. The skin and subcutaneous tissues in the area were anesthetized with 1% lidocaine. A 25-gauge, 3.5 inch needle was directed toward the targeted point under fluoroscopy until bone was contacted. The needle was then walked inferiorly until the neural foramen was entered. A lateral fluoroscopic view was then used to place the needle tip at the 10 o'clock position of the foramen.     The left L3-L4 vertebral bodies were identified with AP fluoroscopy. An oblique view to the right was obtained to better visualize the inferior junction of the pedicle and transverse process. The 6 o'clock position of the pedicle was marked and identified. The skin and subcutaneous tissues in the area were anesthetized with 1% lidocaine. A 25-gauge, 3.5 inch needle  was directed toward the targeted point under fluoroscopy until bone was contacted. The needle was then walked inferiorly until the neural foramen was entered. A lateral fluoroscopic view was then used to place the needle tip at the 10 o'clock position of the foramen.     Negative aspiration was confirmed, and 1ml of contrast was injected at each level. Appropriate neurograms were observed under live AP fluoroscopy with no noted vascular or intrathecal uptake. Then, after negative aspiration, a solution consisting of 4 mL 1% lidocaine and ?10 (steroid) mg dexamethasone with 2.5 mL of solution easily injected at each level. The needles were removed with a 1% lidocaine flush. The patient's back was cleaned and a bandage was placed over the needle insertion points.    The same procedure was performed on the opposite side? No    Disposition: The patient tolerated the procedure well, and there were no apparent complications. Vital signs remained stable througtout the procedure. The patient was taken to the recovery area where discharge instructions for the procedure were given.     Estimated Blood Loss: minimal    Specimens: none    Complications: none      This patient's clinical history, exam, AND imaging support radiculopathy AND there is a significant impact on quality of life and function AND the pain has been present for at least 4 weeks AND they have failed to improve with noninvasive conservative care.            Administrations This Visit     dexamethasone PF (DECADRON) epidural injection 10 mg     Admin Date  11/09/2021 Action  Given Dose  10 mg Route  Epidural Administered By  Ceasar Lund, APRN-NP          iohexoL (OMNIPAQUE-240) 240 mg/mL injection 1 mL     Admin Date  11/09/2021 Action  Given Dose  1 mL Route  Epidural Administered By  Ceasar Lund, APRN-NP          lidocaine PF 1% (10 mg/mL) injection 2 mL     Admin Date  11/09/2021 Action  Given Dose  2 mL Route  Injection Administered By  Ceasar Lund, APRN-NP              Estimated blood loss: none or minimal  Specimens: none  Patient tolerated the procedure well with no immediate complications. Pressure was applied, and hemostasis was accomplished.

## 2021-11-09 NOTE — Discharge Instructions - Supplementary Instructions
General Post-Procedure Instructions: Pain Injection      Procedure Completed Today:  Joint Injection (hip, knee, shoulder)  Epidural Steroid Injection (Cervical; Thoracic; Lumbar)  Transforaminal Steroid Injection (Cervical; Lumbar)  Facet Joint Injection  Other: ___________________________    Important information following your procedure today:  You may drive today     If you had sedation, you may NOT drive today  Rest at home for the next 6 hours.  You may then begin to resume your normal activities.  DO NOT drive any vehicle, operate any power tools, drink alcohol, make any major decisions, or sign any legal documents for the next 12 hours.  Pain relief may not be immediate. It is possible you may even experience an increase in pain during the first 24-48 hours followed by a gradual decrease of your pain.  Though the procedure is generally safe, and complications are rare, we do ask that you be aware of any of the following:  Any swelling, persistent redness, new bleeding or drainage from the site of the injection.  You should not experience a severe headache.  You should not run a fever over 101oF.  New onset of sharp, severe back and or neck pain.  New onset of upper or lower extremity numbness or weakness.  New difficulty controlling bowel or bladder function after injection.  New shortness of breath.  If any of these occur, please call to report this occurrence to a nurse at [phone]. If you are calling after 4:00pm, on the weekends or holidays please call [phone] and ask to have the pain management resident physician on call for the physician paged or go to your local emergency room.   You may experience soreness at the injection site. Ice can be applied at 20-minute intervals for the first 24 hours. The following day you may alternate ice with heat if you are experiencing muscle tightness, otherwise continue with ice. Ice works best at decreasing pain. Avoid application of direct heat, hot showers or hot tubs today.  Avoid strenuous activity today. You many resume your regular activities and exercise tomorrow.  Patients with diabetes may see an elevation in blood sugars for 7-10 days after the injection. It is important to pay close attention to your diet, check your blood sugars daily and report extreme elevations to the physician that treats your diabetes.  Patients taking daily blood thinners can resume their regular dose this evening.  It is important that you take all medications ordered by your pain physician. Taking medications as ordered is an important part of your pain care plan. If you cannot continue the medication plan, please notify the physician.    Possible side effects to steroids that may occur:  Flushing or redness of the face  Irritability  Fluid retention  Change in women's menses    Follow up appointment as needed. In the event you are unable to keep an appointment, please notify the scheduler 24 hours in advance at [phone].

## 2021-11-09 NOTE — Progress Notes
[facility]  INTERVENTIONAL PAIN PROCEDURE HISTORY AND PHYSICAL    No chief complaint on file.      HISTORY OF PRESENT ILLNESS:  Cynthia Vega is a 88 y.o. year old female who presents for injection.  Denies fevers, chills, or recent hospitalizations.  Patient denies currently taking blood thinning medications.       Medical History:   Diagnosis Date    Hypertension        History reviewed. No pertinent surgical history.    family history is not on file.    Social History     Socioeconomic History    Marital status: Widowed   Tobacco Use    Smoking status: Never    Smokeless tobacco: Never   Substance and Sexual Activity    Alcohol use: Not Currently    Drug use: Never       Allergies   Allergen Reactions    Gabapentin RASH       Vitals:    11/09/21 0936   BP Source: Arm, Left Upper   Temp: 36.7 C (98.1 F)   O2 Device: None (Room air)             REVIEW OF SYSTEMS: 10 point ROS obtained and negative except back pain      PHYSICAL EXAM:  General: 88 y.o. female appears stated age, in no acute distress  HEENT: Normocephalic, atraumatic  Neck: No thyroidmegaly  Cardiovascular: Well perfused  Pulmonary: Unlabored respirations  Extremities: No cyanosis, clubbing, or edema  Skin: No lesions seen on exposed skin  Psychiatric:  Appropriate mood and affect  Musculoskeletal: No atrophy.   Neurologic: Antigravity strength in all extremities. CN II -XII grossly intact.  Alert and oriented x 3.           IMPRESSION:    1. Lumbar radiculopathy         PLAN: Lumbar Transforaminal Epidural Steroid Injection left L3-4, L4-5

## 2023-03-03 ENCOUNTER — Encounter: Admit: 2023-03-03 | Discharge: 2023-03-03 | Payer: MEDICARE

## 2023-04-28 ENCOUNTER — Encounter: Admit: 2023-04-28 | Discharge: 2023-04-28

## 2023-06-27 ENCOUNTER — Encounter: Admit: 2023-06-27 | Discharge: 2023-06-27 | Payer: MEDICARE

## 2024-05-20 IMAGING — RF GDSPINE
7 of 9 series · 7 of 9 positions shown · non-contrast
Comparison: none

[Series 1: cp_injection proc · 1 of 1 slices shown (1 of 3)]
[im 1/1]
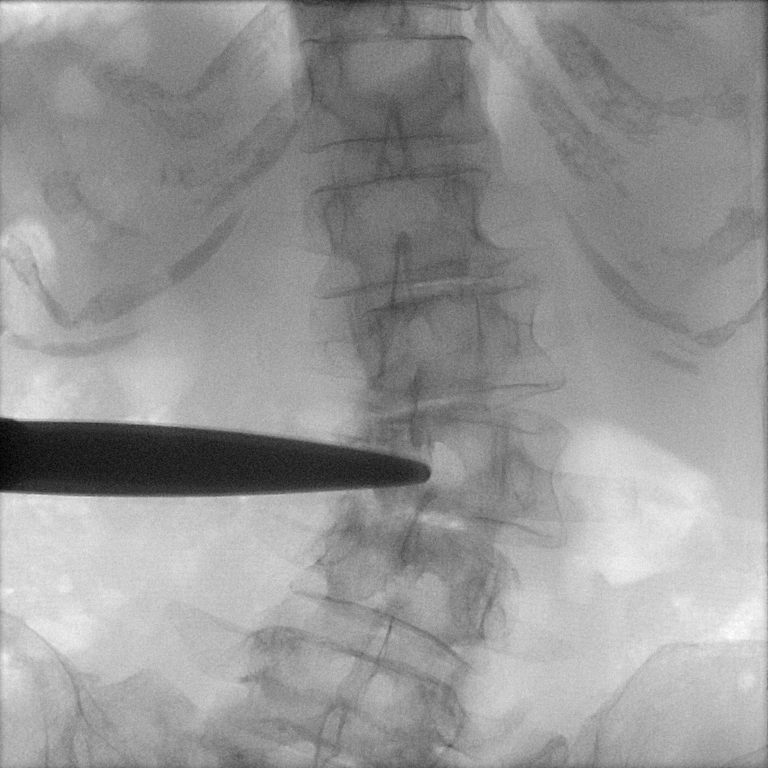

[Series 2: cp_injection proc · 1 of 1 slices shown (2 of 3)]
[im 1/1]
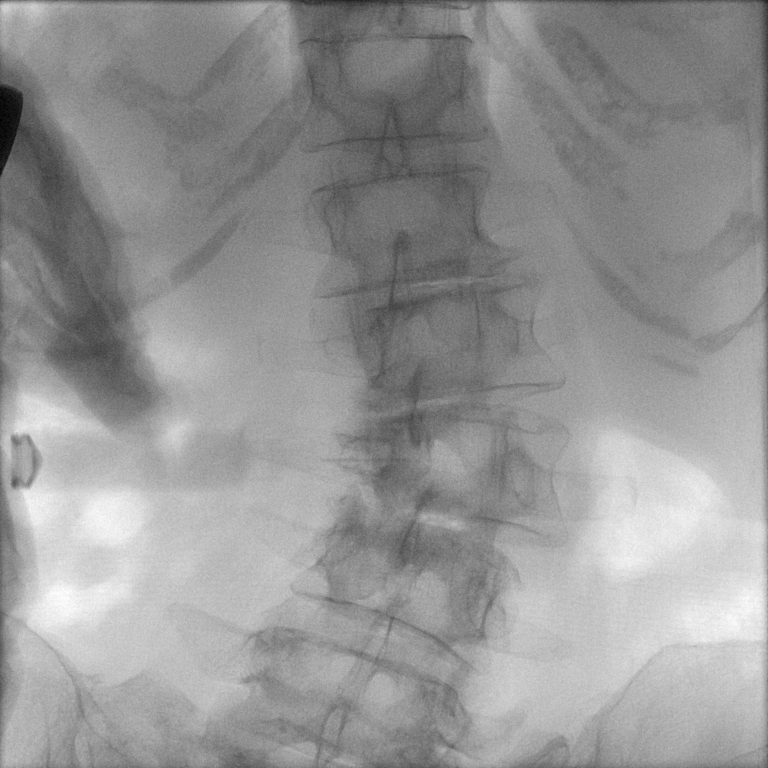

[Series 4: x lumbar spine lat grid · 1 of 1 slices shown]
[im 1/1]
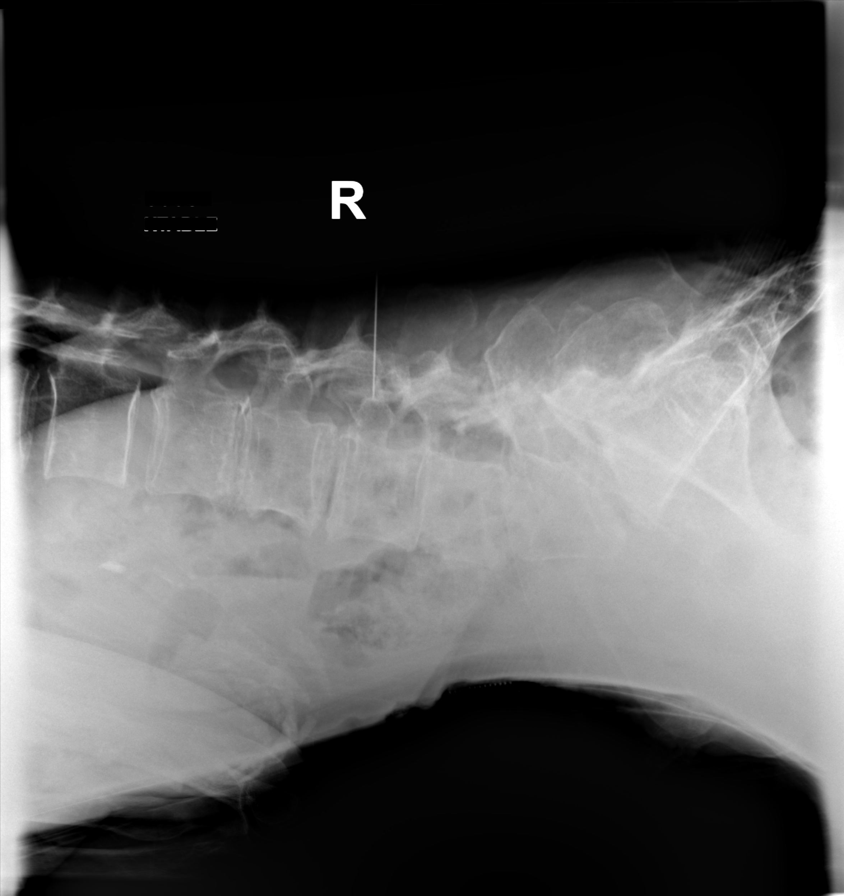

[Series 5: fluoro_myelogram_singleshot_bw · 1 of 1 slices shown]
[im 1/1]
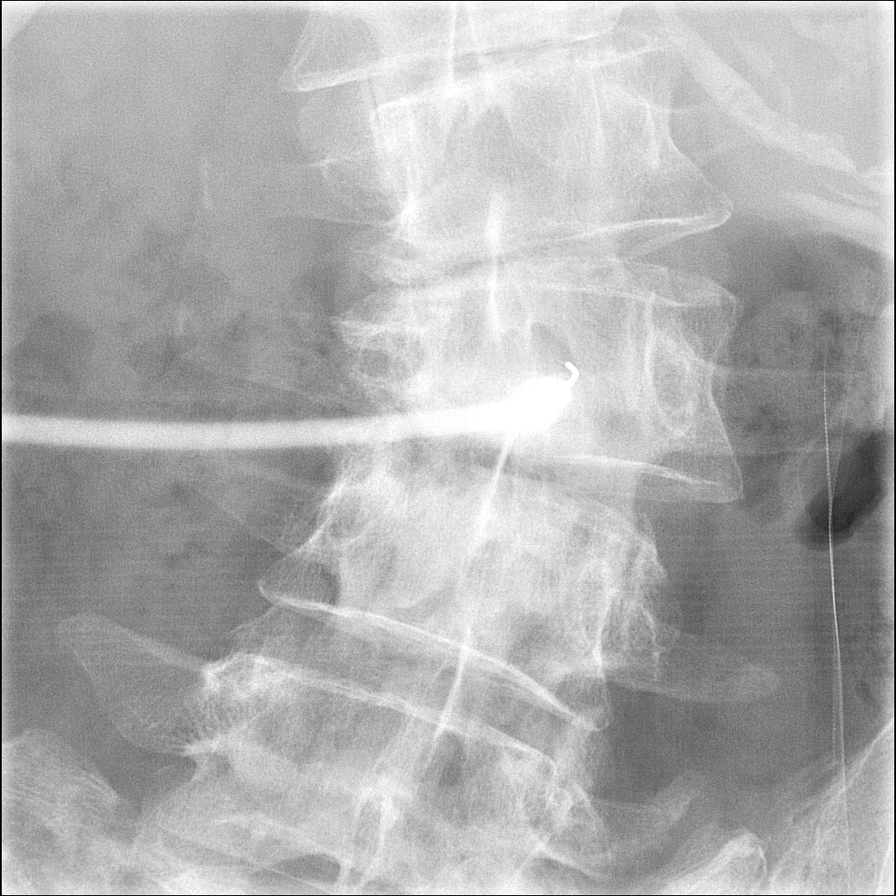

[Series 6: cp_injection proc · 1 of 1 slices shown (3 of 3)]
[im 1/1]
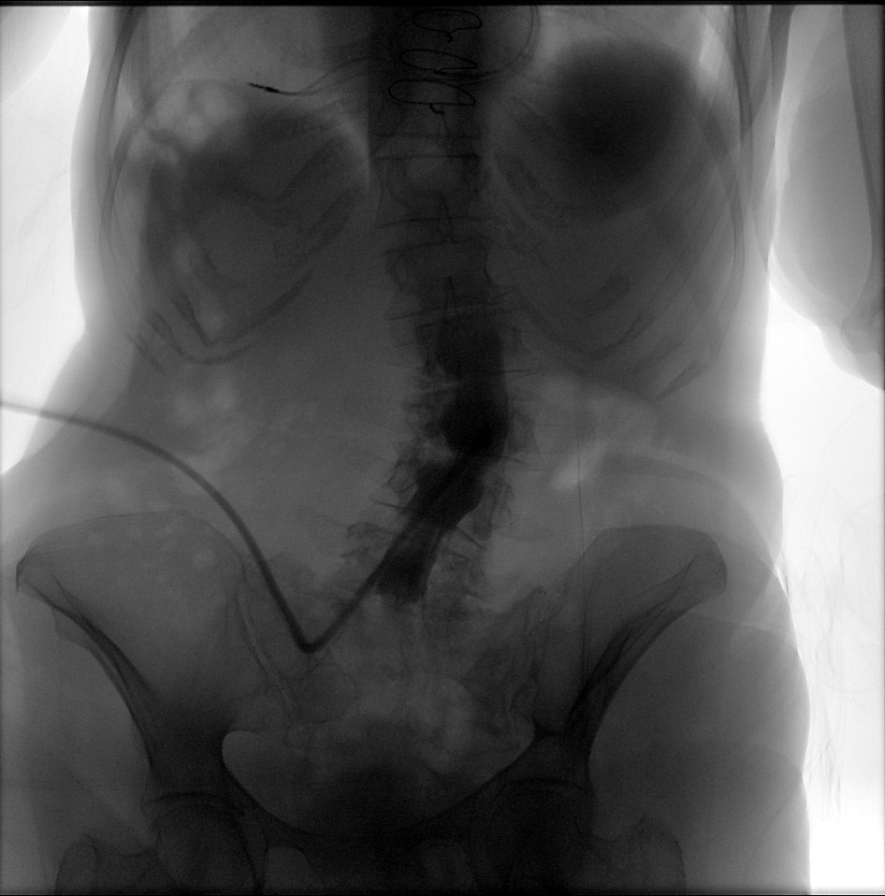

[Series 7: t lumbar spine lat · 1 of 1 slices shown]
[im 1/1]
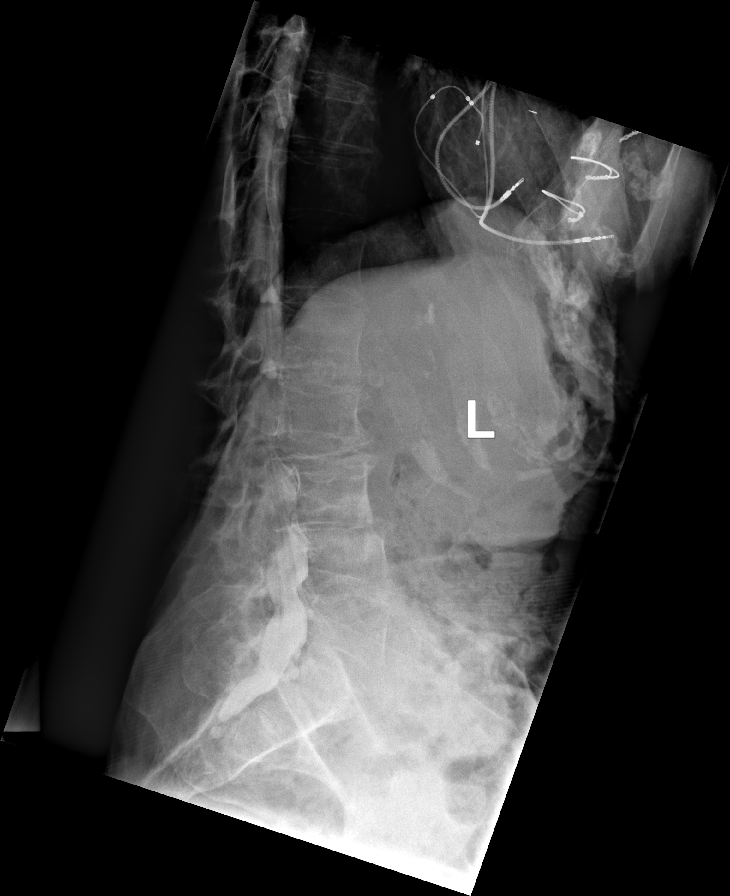

[Series 10: t lumbar spine obl · 1 of 1 slices shown]
[im 1/1]
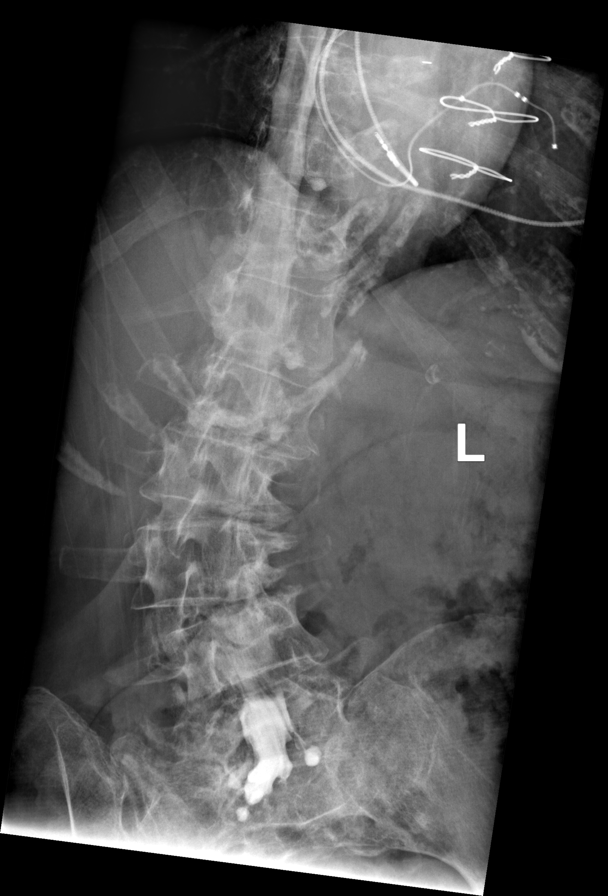

[7 of 9 positions shown; findings below may reference images not displayed]

DIAGNOSTIC STUDIES

EXAM

FLUOROSCOPY GUIDED MYELOGRAM; CT MYELOGRAM

INDICATION

Lower extremity radiculopathy.]
myleo gram of lumbar spine  162s  55.5n mGy

TECHNIQUE AND PROCEDURE

The procedure, its risks and benefits were discussed with the patient. Immediately afterwards, the
patient was placed in the fluoroscopy table in the prone position. The skin was prepped and draped i
n the usual sterile fashion in the soft tissues were infiltrated with 1 percent xylocaine.
Subsequently, a 22 gauge spinal needle was advanced into the thecal sac at the L3-4 level. A total
of 20 mL of Omnipaque 180 intrathecal contrast material were infused into the thecal sac and AP,
lateral and oblique images of the lumbar spine were generated.

Subsequently, the patient was taken to the CT suite and CT  myelogram was performed utilizing a
multidetector CT scanner. Multiple, contiguous transaxial images were obtained. Sagittal and coronal
re-formatted images were generated and evaluated as well.

COMPARISONS

Reference is made to lumbar spine series dated 01/28/2019.

FINDINGS

Fluoroscopic guided myelogram:

There is lumbar dextroscoliosis with the apex of the curve at the L3 vertebral body level.
Osteophytic spurring is seen along the concave aspect of the curve on the left side.

Lateral view demonstrates ventral impingement on the thecal sac at the L4-5 level suggestive of
posterior disc bulging. Perineural Tarlov cysts are seen at the S1-2 level.

CT myelogram:

Conus medullaris is identified at the T12-L1 level. Perineural nerve root sleeve cysts are
identified at multiple levels bilaterally.

At the T12-L1 level there is minimal posterior disc bulging. There is no central canal stenosis or
neural foraminal stenosis.

At the L1-2 level there is intervertebral disc space narrowing and vacuum disc degeneration.
Posterior disc bulging is present with mild ventral impingement on the thecal sac. The right-sided
neural foramina is patent with minimal narrowing along the inferior recess secondary to lateral disc
bulging. The left-sided neural foramina is patent as well.

At the L2-3 level there is intervertebral disc space narrowing and vacuum disc degeneration. There
is ventral impingement on the thecal sac. The right-sided neural foramina is patent. There is
moderately severe narrowing on the left side secondary to lateral disc osteophyte complex formation
and lateral disc bulging.

At the L3-4 level there is intervertebral disc space narrowing and vacuum disc degeneration.
Posterior disc bulging is present with ventral impingement on the thecal sac. The right-sided neural
foramina is mildly narrowed secondary to lateral disc bulging. There is facet arthrosis present.

The left-sided neural foramina demonstrates mild to moderate narrowing secondary to lateral disc
bulging and facet arthrosis.

At the L4-5 level the there is moderately severe multifactorial central canal stenosis secondary to
posterior disc bulging, ligamentum flavum hypertrophy and facet arthrosis. The right-sided neural
foramina is moderately narrowed secondary to lateral disc bulging. There is moderately severe
narrowing on the left side secondary to lateral disc bulging and facet arthrosis.

At the L5-S1 level there is intervertebral disc space narrowing present. Broad-based posterior disc
bulging is identified with mild ventral impingement on the thecal sac. There is mild grade 1
anterolisthesis of L5 on S1. Bilateral facet arthrosis is present as well.

There is moderate bilateral neural foraminal narrowing secondary to lateral disc bulging and facet
arthrosis.

The paravertebral musculature appears intact. Visualized aspects of the kidneys are within normal
limits. There is calcified plaque in the aorta and its bifurcation.

IMPRESSION

Multilevel lumbar spondylosis and dextroscoliosis as described in detail level by level in the above
report.

Tech Notes:

myleo gram of lumbar spine
